# Patient Record
Sex: Female | Born: 1980 | Race: White | Hispanic: No | Marital: Single | Smoking: Current every day smoker
Health system: Southern US, Community
[De-identification: ages and names within clinical notes are randomized; demographics above are authoritative.]

## PROBLEM LIST (undated history)

## (undated) DIAGNOSIS — F410 Panic disorder [episodic paroxysmal anxiety] without agoraphobia: Secondary | ICD-10-CM

## (undated) DIAGNOSIS — G43909 Migraine, unspecified, not intractable, without status migrainosus: Secondary | ICD-10-CM

## (undated) DIAGNOSIS — F1910 Other psychoactive substance abuse, uncomplicated: Secondary | ICD-10-CM

## (undated) DIAGNOSIS — I10 Essential (primary) hypertension: Secondary | ICD-10-CM

## (undated) DIAGNOSIS — F419 Anxiety disorder, unspecified: Secondary | ICD-10-CM

## (undated) HISTORY — PX: CHOLECYSTECTOMY: SHX55

## (undated) HISTORY — PX: LIPOMA EXCISION: SHX5283

---

## 2013-09-02 ENCOUNTER — Emergency Department (HOSPITAL_BASED_OUTPATIENT_CLINIC_OR_DEPARTMENT_OTHER)
Admission: EM | Admit: 2013-09-02 | Discharge: 2013-09-02 | Disposition: A | Discharge status: Home / Self Care | Payer: Self-pay | Attending: Emergency Medicine | Admitting: Emergency Medicine

## 2013-09-02 ENCOUNTER — Encounter (HOSPITAL_BASED_OUTPATIENT_CLINIC_OR_DEPARTMENT_OTHER): Payer: Self-pay | Admitting: Emergency Medicine

## 2013-09-02 DIAGNOSIS — F17200 Nicotine dependence, unspecified, uncomplicated: Secondary | ICD-10-CM | POA: Insufficient documentation

## 2013-09-02 DIAGNOSIS — R51 Headache: Secondary | ICD-10-CM | POA: Insufficient documentation

## 2013-09-02 DIAGNOSIS — I10 Essential (primary) hypertension: Secondary | ICD-10-CM | POA: Diagnosis present

## 2013-09-02 DIAGNOSIS — Z3202 Encounter for pregnancy test, result negative: Secondary | ICD-10-CM | POA: Insufficient documentation

## 2013-09-02 DIAGNOSIS — Z8659 Personal history of other mental and behavioral disorders: Secondary | ICD-10-CM | POA: Insufficient documentation

## 2013-09-02 HISTORY — DX: Panic disorder (episodic paroxysmal anxiety): F41.0

## 2013-09-02 HISTORY — DX: Essential (primary) hypertension: I10

## 2013-09-02 HISTORY — DX: Anxiety disorder, unspecified: F41.9

## 2013-09-02 MED ORDER — LISINOPRIL-HYDROCHLOROTHIAZIDE 10-12.5 MG PO TABS: 1.0000 | ORAL_TABLET | Freq: Every day | ORAL | Status: AC

## 2013-09-02 MED ORDER — IBUPROFEN 400 MG PO TABS
600.0000 mg | ORAL_TABLET | Freq: Once | ORAL | Status: AC
  Administered 2013-09-02: 600 mg via ORAL
  Filled 2013-09-02 (×2): qty 1

## 2013-09-02 NOTE — ED Notes (Signed)
First day at [facility] blood pressure was high sent here for evaluation and possible medication. Pt reports to me that June through August she was in [facility] and they had her on hctz, lisinopril and norvasc was not given any medications when released and has not been to a doctor since then.

## 2013-09-02 NOTE — ED Provider Notes (Signed)
CSN: [id number]     Arrival date & time 09/02/13  1034 History   First MD Initiated Contact with Patient 09/02/13 1100     Chief Complaint  Patient presents with  . high blood pressure at [facility]    (Consider location/radiation/quality/duration/timing/severity/associated sxs/prior Treatment) Patient is a 32 y.o. female presenting with hypertension. The history is provided by the patient.  Hypertension This is a chronic problem. The current episode started more than 1 week ago. The problem occurs constantly. The problem has not changed since onset.Associated symptoms include headaches. Pertinent negatives include no chest pain, no abdominal pain and no shortness of breath. Nothing aggravates the symptoms. Nothing relieves the symptoms. She has tried nothing for the symptoms. The treatment provided no relief.    Past Medical History  Diagnosis Date  . Anxiety   . Panic attack   . Hypertension    Past Surgical History  Procedure Laterality Date  . Cholecystectomy    . Cesarean section     History reviewed. No pertinent family history. History  Substance Use Topics  . Smoking status: Current Every Day Smoker  . Smokeless tobacco: Not on file  . Alcohol Use: No   OB History   Grav Para Term Preterm Abortions TAB SAB Ect Mult Living                 Review of Systems  Constitutional: Negative for fever and fatigue.  HENT: Negative for congestion and drooling.   Eyes: Negative for pain.       Episode of double vision lasting a few minutes yesterday.   Respiratory: Negative for cough and shortness of breath.   Cardiovascular: Negative for chest pain.  Gastrointestinal: Negative for nausea, vomiting, abdominal pain and diarrhea.  Genitourinary: Negative for dysuria and hematuria.  Musculoskeletal: Negative for back pain, gait problem and neck pain.  Skin: Negative for color change.  Neurological: Positive for headaches. Negative for dizziness.  Hematological: Negative for  adenopathy.  Psychiatric/Behavioral: Negative for behavioral problems.  All other systems reviewed and are negative.    Allergies  Reglan  Home Medications  No current outpatient prescriptions on file. BP 176/117  Pulse 106  Temp(Src) 97.7 F (36.5 C) (Oral)  Ht 5\' 2"  (1.575 m)  Wt 167 lb 4 oz (75.864 kg)  BMI 30.58 kg/m2  SpO2 100%  LMP 08/30/2013 Physical Exam  Nursing note and vitals reviewed. Constitutional: She is oriented to person, place, and time. She appears well-developed and well-nourished.  HENT:  Head: Normocephalic.  Mouth/Throat: Oropharynx is clear and moist. No oropharyngeal exudate.  Eyes: Conjunctivae and EOM are normal. Pupils are equal, round, and reactive to light.  Neck: Normal range of motion. Neck supple.  Cardiovascular: Normal rate, regular rhythm, normal heart sounds and intact distal pulses.  Exam reveals no gallop and no friction rub.   No murmur heard. Pulmonary/Chest: Effort normal and breath sounds normal. No respiratory distress. She has no wheezes.  Abdominal: Soft. Bowel sounds are normal. There is no tenderness. There is no rebound and no guarding.  Musculoskeletal: Normal range of motion. She exhibits no edema and no tenderness.  Neurological: She is alert and oriented to person, place, and time. She has normal strength. No cranial nerve deficit or sensory deficit. She displays a negative Romberg sign. Coordination and gait normal.  Normal finger to nose bilaterally.  The patient is able to ambulate forwards and backwards without any difficulty.  Skin: Skin is warm and dry.  Psychiatric: She has a  normal mood and affect. Her behavior is normal.    ED Course  Procedures (including critical care time) Labs Review Labs Reviewed  PREGNANCY, URINE   Imaging Review No results found.  EKG Interpretation   None       MDM   1. Hypertension    11:15 AM 32 y.o. female in with a history of hypertension presents with elevated blood  pressure. The patient states that she is currently at a [facility] for Percocet and Xanax abuse. She is mildly hypertensive here and states that she has a 6/10 global headache. She states that she has a history of migraines and does have a history of daily headaches. She states that this headache is consistent with her typical headache. She is afebrile and vital signs are otherwise unremarkable. She has a normal neurologic exam here. She does note that she was hypertensive with previous pregnancies. Will check urine preg. will give Diprivan for her headache. She states that when she was incarcerated between July and August she was started on hydrochlorothiazide lisinopril and Norvasc. I think it is reasonable to restart the HCTZ lisinopril combo medicine.  11:42 AM:  Upreg neg. I have discussed the diagnosis/risks/treatment options with the patient and believe the pt to be eligible for discharge home to follow-up with pcp. We also discussed returning to the ED immediately if new or worsening sx occur. We discussed the sx which are most concerning (e.g., worsening HA, if BP doesn't respond to meds) that necessitate immediate return. Any new prescriptions provided to the patient are listed below.  New Prescriptions   LISINOPRIL-HYDROCHLOROTHIAZIDE (ZESTORETIC) 10-12.5 MG PER TABLET    Take 1 tablet by mouth daily.     Junius Argyle, MD 09/02/13 [phone]

## 2013-09-02 NOTE — ED Notes (Signed)
Pt states she is at [facility] and has been off of opiates for 2 months does not want any benzodiazepines or narcotics.

## 2013-09-08 ENCOUNTER — Encounter (HOSPITAL_BASED_OUTPATIENT_CLINIC_OR_DEPARTMENT_OTHER): Payer: Self-pay | Admitting: Emergency Medicine

## 2013-09-08 ENCOUNTER — Emergency Department (HOSPITAL_BASED_OUTPATIENT_CLINIC_OR_DEPARTMENT_OTHER)
Admission: EM | Admit: 2013-09-08 | Discharge: 2013-09-08 | Disposition: A | Discharge status: Home / Self Care | Payer: Self-pay | Attending: Emergency Medicine | Admitting: Emergency Medicine

## 2013-09-08 ENCOUNTER — Emergency Department (HOSPITAL_BASED_OUTPATIENT_CLINIC_OR_DEPARTMENT_OTHER): Payer: Self-pay

## 2013-09-08 DIAGNOSIS — R000 Tachycardia, unspecified: Secondary | ICD-10-CM | POA: Insufficient documentation

## 2013-09-08 DIAGNOSIS — F17200 Nicotine dependence, unspecified, uncomplicated: Secondary | ICD-10-CM | POA: Insufficient documentation

## 2013-09-08 DIAGNOSIS — I10 Essential (primary) hypertension: Secondary | ICD-10-CM | POA: Insufficient documentation

## 2013-09-08 DIAGNOSIS — Z79899 Other long term (current) drug therapy: Secondary | ICD-10-CM | POA: Insufficient documentation

## 2013-09-08 DIAGNOSIS — J029 Acute pharyngitis, unspecified: Secondary | ICD-10-CM | POA: Insufficient documentation

## 2013-09-08 DIAGNOSIS — J40 Bronchitis, not specified as acute or chronic: Secondary | ICD-10-CM | POA: Insufficient documentation

## 2013-09-08 DIAGNOSIS — Z8659 Personal history of other mental and behavioral disorders: Secondary | ICD-10-CM | POA: Insufficient documentation

## 2013-09-08 HISTORY — DX: Other psychoactive substance abuse, uncomplicated: F19.10

## 2013-09-08 HISTORY — DX: Migraine, unspecified, not intractable, without status migrainosus: G43.909

## 2013-09-08 MED ORDER — BENZONATATE 100 MG PO CAPS: 100.0000 mg | ORAL_CAPSULE | Freq: Three times a day (TID) | ORAL | Status: AC

## 2013-09-08 MED ORDER — BENZONATATE 100 MG PO CAPS: 100.0000 mg | ORAL_CAPSULE | Freq: Once | ORAL | Status: DC

## 2013-09-08 MED ORDER — BENZONATATE 100 MG PO CAPS
100.0000 mg | ORAL_CAPSULE | Freq: Once | ORAL | Status: AC
  Administered 2013-09-08: 100 mg via ORAL
  Filled 2013-09-08: qty 1

## 2013-09-08 MED ORDER — IBUPROFEN 800 MG PO TABS: 800.0000 mg | ORAL_TABLET | Freq: Three times a day (TID) | ORAL | Status: AC

## 2013-09-08 MED ORDER — KETOROLAC TROMETHAMINE 60 MG/2ML IM SOLN
60.0000 mg | Freq: Once | INTRAMUSCULAR | Status: AC
  Administered 2013-09-08: 60 mg via INTRAMUSCULAR
  Filled 2013-09-08: qty 2

## 2013-09-08 NOTE — ED Notes (Signed)
Productive Cough and runny nose x2 days.  Pt presents from [facility]

## 2013-09-08 NOTE — ED Notes (Signed)
MD at bedside. 

## 2013-09-08 NOTE — ED Provider Notes (Signed)
CSN: [id number]     Arrival date & time 09/08/13  4098 History   First MD Initiated Contact with Patient 09/08/13 [phone]     Chief Complaint  Patient presents with  . Cough  . Nasal Congestion   (Consider location/radiation/quality/duration/timing/severity/associated sxs/prior Treatment) Patient is a 32 y.o. female presenting with cough. The history is provided by the patient.  Cough Cough characteristics:  Productive Sputum characteristics:  Green Severity:  Moderate Onset quality:  Gradual Duration:  2 days Timing:  Constant Progression:  Worsening Chronicity:  New Smoker: yes   Context: upper respiratory infection   Context: not sick contacts and not weather changes   Relieved by:  Nothing Worsened by:  Nothing tried Ineffective treatments:  Cough suppressants Associated symptoms: rhinorrhea and sore throat   Associated symptoms: no chest pain, no fever and no shortness of breath     Past Medical History  Diagnosis Date  . Anxiety   . Panic attack   . Hypertension   . Substance abuse   . Migraine    Past Surgical History  Procedure Laterality Date  . Cholecystectomy    . Cesarean section    . Lipoma excision     No family history on file. History  Substance Use Topics  . Smoking status: Current Every Day Smoker -- 0.25 packs/day    Types: Cigarettes  . Smokeless tobacco: Not on file  . Alcohol Use: No   OB History   Grav Para Term Preterm Abortions TAB SAB Ect Mult Living                 Review of Systems  Constitutional: Negative for fever.  HENT: Positive for congestion, postnasal drip, rhinorrhea and sore throat.   Respiratory: Positive for cough. Negative for shortness of breath.   Cardiovascular: Negative for chest pain and leg swelling.  All other systems reviewed and are negative.    Allergies  Reglan  Home Medications   Current Outpatient Rx  Name  Route  Sig  Dispense  Refill  . lisinopril-hydrochlorothiazide (ZESTORETIC) 10-12.5 MG  per tablet   Oral   Take 1 tablet by mouth daily.   30 tablet   1    BP 151/107  Pulse 124  Temp(Src) 98.9 F (37.2 C) (Oral)  Resp 16  Ht 5\' 2"  (1.575 m)  Wt 167 lb (75.751 kg)  BMI 30.54 kg/m2  SpO2 99%  LMP 09/08/2013 Physical Exam  Nursing note and vitals reviewed. Constitutional: She is oriented to person, place, and time. She appears well-developed and well-nourished. No distress.  HENT:  Head: Normocephalic and atraumatic.  Eyes: EOM are normal. Pupils are equal, round, and reactive to light.  Neck: Normal range of motion. Neck supple.  Cardiovascular: Regular rhythm.  Tachycardia present.  Exam reveals no friction rub.   No murmur heard. Pulmonary/Chest: Effort normal and breath sounds normal. No respiratory distress. She has no wheezes. She has no rales.  Abdominal: Soft. She exhibits no distension. There is no tenderness. There is no rebound.  Musculoskeletal: Normal range of motion. She exhibits no edema.  Neurological: She is alert and oriented to person, place, and time.  Skin: She is not diaphoretic.    ED Course  Procedures (including critical care time) Labs Review Labs Reviewed - No data to display Imaging Review No results found.  EKG Interpretation   None       MDM   1. Bronchitis    36F presents with URI symptoms. Productive cough with  green sputum now after 2 days of nasal congestion, sore throat, headache. No fevers. Here tachycardic, states this is normal due to her panic disorder, normally has HRs in the 130s-140s.  TMs clear, oropharynx clear. No sinus tenderness. Lungs clear on CXR. Will obtain CXR. Toradol IM given.  X-ray negative for pneumonia. Patient's clinical picture consistent with bronchitis. Given Tessalon Perles and Motrin. stable for discharge.  Dagmar Hait, MD 09/08/13 [phone]

## 2013-09-08 NOTE — Discharge Instructions (Signed)

## 2013-10-05 ENCOUNTER — Encounter (HOSPITAL_BASED_OUTPATIENT_CLINIC_OR_DEPARTMENT_OTHER): Payer: Self-pay | Admitting: Emergency Medicine

## 2013-10-05 ENCOUNTER — Emergency Department (HOSPITAL_BASED_OUTPATIENT_CLINIC_OR_DEPARTMENT_OTHER)
Admission: EM | Admit: 2013-10-05 | Discharge: 2013-10-05 | Disposition: A | Discharge status: Home / Self Care | Payer: Self-pay | Attending: Emergency Medicine | Admitting: Emergency Medicine

## 2013-10-05 DIAGNOSIS — Z8659 Personal history of other mental and behavioral disorders: Secondary | ICD-10-CM | POA: Insufficient documentation

## 2013-10-05 DIAGNOSIS — A5901 Trichomonal vulvovaginitis: Secondary | ICD-10-CM | POA: Insufficient documentation

## 2013-10-05 DIAGNOSIS — I10 Essential (primary) hypertension: Secondary | ICD-10-CM | POA: Insufficient documentation

## 2013-10-05 DIAGNOSIS — N390 Urinary tract infection, site not specified: Secondary | ICD-10-CM | POA: Insufficient documentation

## 2013-10-05 DIAGNOSIS — Z87891 Personal history of nicotine dependence: Secondary | ICD-10-CM | POA: Insufficient documentation

## 2013-10-05 DIAGNOSIS — Z79899 Other long term (current) drug therapy: Secondary | ICD-10-CM | POA: Insufficient documentation

## 2013-10-05 DIAGNOSIS — R109 Unspecified abdominal pain: Secondary | ICD-10-CM

## 2013-10-05 DIAGNOSIS — Z3202 Encounter for pregnancy test, result negative: Secondary | ICD-10-CM | POA: Insufficient documentation

## 2013-10-05 LAB — URINE MICROSCOPIC-ADD ON

## 2013-10-05 LAB — URINALYSIS, ROUTINE W REFLEX MICROSCOPIC
Bilirubin Urine: NEGATIVE
GLUCOSE, UA: NEGATIVE mg/dL
KETONES UR: NEGATIVE mg/dL
NITRITE: NEGATIVE
PROTEIN: NEGATIVE mg/dL
Specific Gravity, Urine: 1.029 (ref 1.005–1.030)
Urobilinogen, UA: 1 mg/dL (ref 0.0–1.0)
pH: 6 (ref 5.0–8.0)

## 2013-10-05 LAB — WET PREP, GENITAL: Yeast Wet Prep HPF POC: NONE SEEN

## 2013-10-05 LAB — PREGNANCY, URINE: PREG TEST UR: NEGATIVE

## 2013-10-05 MED ORDER — AZITHROMYCIN 250 MG PO TABS
1000.0000 mg | ORAL_TABLET | Freq: Once | ORAL | Status: AC
  Administered 2013-10-05: 1000 mg via ORAL
  Filled 2013-10-05: qty 4

## 2013-10-05 MED ORDER — CEFTRIAXONE SODIUM 250 MG IJ SOLR
250.0000 mg | Freq: Once | INTRAMUSCULAR | Status: AC
  Administered 2013-10-05: 250 mg via INTRAMUSCULAR

## 2013-10-05 MED ORDER — LIDOCAINE HCL (PF) 1 % IJ SOLN
INTRAMUSCULAR | Status: AC
  Filled 2013-10-05: qty 5

## 2013-10-05 MED ORDER — METRONIDAZOLE 500 MG PO TABS
2000.0000 mg | ORAL_TABLET | Freq: Once | ORAL | Status: AC
  Administered 2013-10-05: 2000 mg via ORAL
  Filled 2013-10-05: qty 4

## 2013-10-05 MED ORDER — ONDANSETRON 8 MG PO TBDP
8.0000 mg | ORAL_TABLET | Freq: Once | ORAL | Status: AC
  Administered 2013-10-05: 8 mg via ORAL
  Filled 2013-10-05: qty 1

## 2013-10-05 MED ORDER — SULFAMETHOXAZOLE-TRIMETHOPRIM 800-160 MG PO TABS: 1.0000 | ORAL_TABLET | Freq: Two times a day (BID) | ORAL | Status: AC

## 2013-10-05 MED ORDER — KETOROLAC TROMETHAMINE 60 MG/2ML IM SOLN
30.0000 mg | Freq: Once | INTRAMUSCULAR | Status: AC
  Administered 2013-10-05: 30 mg via INTRAMUSCULAR
  Filled 2013-10-05: qty 2

## 2013-10-05 NOTE — ED Notes (Signed)
Notices blood after urinating also having some lower abdominal cramping and low back pain

## 2013-10-05 NOTE — ED Provider Notes (Signed)
CSN: [id number]     Arrival date & time 10/05/13  0824 History   First MD Initiated Contact with Patient 10/05/13 [phone]     Chief Complaint  Patient presents with  . possible uti    Patient is a 33 y.o. female presenting with cramps. The history is provided by the patient.  Abdominal Cramping This is a new problem. The current episode started yesterday. The problem has not changed since onset.Associated symptoms include abdominal pain. Pertinent negatives include no chest pain. Exacerbated by: urination. Nothing relieves the symptoms. Treatments tried: ibuprofen. The treatment provided no relief.    Past Medical History  Diagnosis Date  . Anxiety   . Panic attack   . Hypertension   . Substance abuse   . Migraine    Past Surgical History  Procedure Laterality Date  . Cholecystectomy    . Cesarean section    . Lipoma excision     History reviewed. No pertinent family history. History  Substance Use Topics  . Smoking status: Former Smoker -- 0.25 packs/day    Types: Cigarettes  . Smokeless tobacco: Not on file  . Alcohol Use: No   OB History   Grav Para Term Preterm Abortions TAB SAB Ect Mult Living                 Review of Systems  Constitutional: Negative for fever.  Cardiovascular: Negative for chest pain.  Gastrointestinal: Positive for abdominal pain. Negative for vomiting.  Genitourinary: Positive for frequency and flank pain. Negative for vaginal bleeding and vaginal discharge.       "abnormal smell"   Neurological: Negative for weakness.  All other systems reviewed and are negative.    Allergies  Reglan  Home Medications   Current Outpatient Rx  Name  Route  Sig  Dispense  Refill  . benzonatate (TESSALON) 100 MG capsule   Oral   Take 1 capsule (100 mg total) by mouth every 8 (eight) hours.   21 capsule   0   . ibuprofen (ADVIL,MOTRIN) 800 MG tablet   Oral   Take 1 tablet (800 mg total) by mouth 3 (three) times daily.   21 tablet   0   .  lisinopril-hydrochlorothiazide (ZESTORETIC) 10-12.5 MG per tablet   Oral   Take 1 tablet by mouth daily.   30 tablet   1    BP 143/95  Pulse 112  Temp(Src) 97.8 F (36.6 C) (Oral)  Resp 18  Ht 5\' 2"  (1.575 m)  Wt 167 lb (75.751 kg)  BMI 30.54 kg/m2  SpO2 100%  LMP 09/30/2013 Physical Exam CONSTITUTIONAL: Well developed/well nourished HEAD: Normocephalic/atraumatic EYES: EOMI/PERRL ENMT: Mucous membranes moist NECK: supple no meningeal signs SPINE:entire spine nontender CV: S1/S2 noted, no murmurs/rubs/gallops noted LUNGS: Lungs are clear to auscultation bilaterally, no apparent distress ABDOMEN: soft, nontender, no rebound or guarding GU:no cva tenderness, mild cmt, no adnexal tenderness/mass noted. Small amt of discharge, No vag bleeding noted.  Female chaperone present NEURO: Pt is awake/alert, moves all extremitiesx4 EXTREMITIES: pulses normal, full ROM SKIN: warm, color normal PSYCH: no abnormalities of mood noted  ED Course  Procedures (including critical care time) Labs Review Labs Reviewed  WET PREP, GENITAL  GC/CHLAMYDIA PROBE AMP  URINALYSIS, ROUTINE W REFLEX MICROSCOPIC  PREGNANCY, URINE   Imaging Review No results found.  EKG Interpretation   None      9:04 AM Pt requests toradol at this time Advised pelvic rest and no sexual activity until all symptoms resolved  and tests are resulted Will treat for trich, uti and also empirically treat for GC/Chlamydia as she thinks she may have been exposed MDM  No diagnosis found. Nursing notes including past medical history and social history reviewed and considered in documentation Labs/vital reviewed and considered     Joya Gaskins, MD 10/05/13 1007

## 2013-10-05 NOTE — ED Notes (Signed)
Patient states she is currently a resident at [facility].

## 2013-10-06 LAB — GC/CHLAMYDIA PROBE AMP
CT Probe RNA: NEGATIVE
GC Probe RNA: NEGATIVE

## 2013-10-06 LAB — URINE CULTURE: Colony Count: 7000

## 2013-10-10 ENCOUNTER — Emergency Department (HOSPITAL_BASED_OUTPATIENT_CLINIC_OR_DEPARTMENT_OTHER): Payer: Self-pay

## 2013-10-10 ENCOUNTER — Encounter (HOSPITAL_BASED_OUTPATIENT_CLINIC_OR_DEPARTMENT_OTHER): Payer: Self-pay | Admitting: Emergency Medicine

## 2013-10-10 ENCOUNTER — Emergency Department (HOSPITAL_BASED_OUTPATIENT_CLINIC_OR_DEPARTMENT_OTHER)
Admission: EM | Admit: 2013-10-10 | Discharge: 2013-10-11 | Disposition: A | Discharge status: Home / Self Care | Payer: Self-pay | Attending: Emergency Medicine | Admitting: Emergency Medicine

## 2013-10-10 DIAGNOSIS — Z87891 Personal history of nicotine dependence: Secondary | ICD-10-CM | POA: Insufficient documentation

## 2013-10-10 DIAGNOSIS — Z79899 Other long term (current) drug therapy: Secondary | ICD-10-CM | POA: Insufficient documentation

## 2013-10-10 DIAGNOSIS — I10 Essential (primary) hypertension: Secondary | ICD-10-CM | POA: Insufficient documentation

## 2013-10-10 DIAGNOSIS — Z791 Long term (current) use of non-steroidal anti-inflammatories (NSAID): Secondary | ICD-10-CM | POA: Insufficient documentation

## 2013-10-10 DIAGNOSIS — F410 Panic disorder [episodic paroxysmal anxiety] without agoraphobia: Secondary | ICD-10-CM | POA: Insufficient documentation

## 2013-10-10 DIAGNOSIS — X500XXA Overexertion from strenuous movement or load, initial encounter: Secondary | ICD-10-CM | POA: Insufficient documentation

## 2013-10-10 DIAGNOSIS — Y929 Unspecified place or not applicable: Secondary | ICD-10-CM | POA: Insufficient documentation

## 2013-10-10 DIAGNOSIS — S93409A Sprain of unspecified ligament of unspecified ankle, initial encounter: Secondary | ICD-10-CM | POA: Insufficient documentation

## 2013-10-10 DIAGNOSIS — Y9302 Activity, running: Secondary | ICD-10-CM | POA: Insufficient documentation

## 2013-10-10 MED ORDER — MELOXICAM 7.5 MG PO TABS: 7.5000 mg | ORAL_TABLET | Freq: Every day | ORAL | Status: AC

## 2013-10-10 NOTE — ED Notes (Signed)
Pt states was running and tripped over curb twisting ankle. Now unable to bear wt on Left foot

## 2013-10-10 NOTE — ED Provider Notes (Signed)
CSN: [id number]     Arrival date & time 10/10/13  2131 History  This chart was scribed for [name], MD by [name], ED Scribe. This patient was seen in room  and the patient's care was started 11:41 PM.    Chief Complaint  Patient presents with  . Ankle Pain    Patient is a 33 y.o. female presenting with ankle pain. The history is provided by the patient. No language interpreter was used.  Ankle Pain Location:  Ankle Injury: yes   Mechanism of injury comment:  Twisted coming off a curb Ankle location:  L ankle Pain details:    Quality:  Aching   Radiates to:  Does not radiate   Severity:  Moderate   Onset quality:  Sudden   Timing:  Constant   Progression:  Unchanged Chronicity:  New Dislocation: no   Foreign body present:  No foreign bodies Relieved by:  Nothing Worsened by:  Nothing tried Ineffective treatments:  None tried Associated symptoms: no back pain   Risk factors: no concern for non-accidental trauma     HPI Comments: Michelle Burke is a 33 y.o. female who presents to the Emergency Department complaining of a left ankle injury that occurred 2-3 hours ago. Pt states she was running when she tripped over a curb and twisted her ankle. She denies any other injuries. She denies numbness or weakness.   Past Medical History  Diagnosis Date  . Anxiety   . Panic attack   . Hypertension   . Substance abuse   . Migraine    Past Surgical History  Procedure Laterality Date  . Cholecystectomy    . Cesarean section    . Lipoma excision     History reviewed. No pertinent family history. History  Substance Use Topics  . Smoking status: Former Smoker -- 0.25 packs/day    Types: Cigarettes  . Smokeless tobacco: Not on file  . Alcohol Use: No   OB History   Grav Para Term Preterm Abortions TAB SAB Ect Mult Living                 Review of Systems  Musculoskeletal: Positive for arthralgias (left ankle pain). Negative for back pain and gait problem.   Neurological: Negative for weakness and numbness.  All other systems reviewed and are negative.    Allergies  Reglan  Home Medications   Current Outpatient Rx  Name  Route  Sig  Dispense  Refill  . QUEtiapine (SEROQUEL) 100 MG tablet   Oral   Take 150 mg by mouth at bedtime.         . traZODone (DESYREL) 50 MG tablet   Oral   Take 50 mg by mouth at bedtime.         . benzonatate (TESSALON) 100 MG capsule   Oral   Take 1 capsule (100 mg total) by mouth every 8 (eight) hours.   21 capsule   0   . ibuprofen (ADVIL,MOTRIN) 800 MG tablet   Oral   Take 1 tablet (800 mg total) by mouth 3 (three) times daily.   21 tablet   0   . lisinopril-hydrochlorothiazide (ZESTORETIC) 10-12.5 MG per tablet   Oral   Take 1 tablet by mouth daily.   30 tablet   1   . sulfamethoxazole-trimethoprim (BACTRIM DS,SEPTRA DS) 800-160 MG per tablet   Oral   Take 1 tablet by mouth 2 (two) times daily.   6 tablet   0  BP 131/90  Pulse 102  Temp(Src) 98.3 F (36.8 C) (Oral)  Resp 18  Wt 173 lb (78.472 kg)  SpO2 100%  LMP 09/30/2013 Physical Exam  Nursing note and vitals reviewed. Constitutional: She is oriented to person, place, and time. She appears well-developed and well-nourished.  HENT:  Head: Normocephalic and atraumatic.  Mouth/Throat: Uvula is midline, oropharynx is clear and moist and mucous membranes are normal. No oropharyngeal exudate.  Eyes: EOM are normal.  Neck: Normal range of motion. Neck supple.  Cardiovascular: Normal rate, regular rhythm and normal heart sounds.   Pulses:      Dorsalis pedis pulses are 3+ on the left side.  Good cap refill < 2 seconds on all toes.  Pulmonary/Chest: Effort normal and breath sounds normal. No respiratory distress. She has no wheezes. She has no rales.  Abdominal: Soft. Bowel sounds are normal. She exhibits no distension. There is no tenderness. There is no rebound and no guarding.  Musculoskeletal: Normal range of motion. She  exhibits tenderness.  Tenderness to palpation of the anterior talor ligament. No crepitus or deformity.  Neurological: She is alert and oriented to person, place, and time. She has normal reflexes. She displays normal reflexes.  NVI distally. Full ROM. Achilles intact.   Skin: Skin is warm and dry.  Psychiatric: She has a normal mood and affect.    ED Course  Procedures (including critical care time)  DIAGNOSTIC STUDIES: Oxygen Saturation is 100% on RA, normal by my interpretation.    COORDINATION OF CARE: 11:41 PM Discussed treatment plan with pt at bedside and pt agreed to plan.   Labs Review Labs Reviewed - No data to display Imaging Review Dg Ankle Complete Left  10/10/2013   CLINICAL DATA:  Left ankle pain after fall.  EXAM: LEFT ANKLE COMPLETE - 3+ VIEW  COMPARISON:  None.  FINDINGS: There is no evidence of fracture, dislocation, or joint effusion. There is no evidence of arthropathy or other focal bone abnormality. Soft tissue swelling over lateral malleolus is noted.  IMPRESSION: No fracture or dislocation is noted. Soft tissue swelling is seen over lateral malleolus suggesting ligamentous injury.   Electronically Signed   By: Roque LiasJames  Green M.D.   On: 10/10/2013 22:51    EKG Interpretation   None       MDM  No diagnosis found.  Will discharge home with ankle ASO, NSAIDs and advice to elevate and ice.   I personally performed the services described in this documentation, which was scribed in my presence. The recorded information has been reviewed and is accurate.    Michelle AweApril K Ailin Rochford-Rasch, MD 10/11/13 [phone]

## 2013-10-10 NOTE — Discharge Instructions (Signed)
Acute Ankle Sprain °with Phase II Rehab °An acute ankle sprain is a partial or complete tear in one or more of the ligaments of the ankle due to traumatic injury. The severity of the injury depends on both the number of ligaments sprained and the grade of sprain. There are 3 grades of sprains. °· A grade 1 sprain is a mild sprain. There is a slight pull without obvious tearing. There is no loss of strength, and the muscle and ligament are the correct length. °· A grade 2 sprain is a moderate sprain. There is tearing of fibers within the substance of the ligament where it connects two bones or two cartilages. The length of the ligament is increased, and there is usually decreased strength. °· A grade 3 sprain is a complete rupture of the ligament and is uncommon. °In addition to the grade of sprain, there are 3 types of ankle sprains.  °Lateral ankle sprains. This is a sprain of one or more of the 3 ligaments on the outer side (lateral) of the ankle. These are the most common sprains. °Medial ankle sprains. There is one large triangular ligament on the inner side (medial) of the ankle that is susceptible to injury. Medial ankle sprains are less common. °Syndesmosis, "high ankle," sprains. The syndesmosis is the ligament that connects the two bones of the lower leg. Syndesmosis sprains usually only occur with very severe ankle sprains. °SYMPTOMS °· Pain, tenderness, and swelling in the ankle, starting at the side of injury that may progress to the whole ankle and foot with time. °· "Pop" or tearing sensation at the time of injury. °· Bruising that may spread to the heel. °· Impaired ability to walk soon after injury. °CAUSES  °· Acute ankle sprains are caused by trauma placed on the ankle that temporarily forces or pries the anklebone (talus) out of its normal socket. °· Stretching or tearing of the ligaments that normally hold the joint in place (usually due to a twisting injury). °RISK INCREASES WITH: °· Previous  ankle sprain. °· Sports in which the foot may land awkwardly (basketball, volleyball, soccer) or walking or running on uneven or rough surfaces. °· Shoes with inadequate support to prevent sideways motion when stress occurs. °· Poor strength and flexibility. °· Poor balance skills. °· Contact sports. °PREVENTION °· Warm up and stretch properly before activity. °· Maintain physical fitness: °· Ankle and leg flexibility, muscle strength, and endurance. °· Cardiovascular fitness. °· Balance training activities. °· Use proper technique and have a coach correct improper technique. °· Taping, protective strapping, bracing, or high-top tennis shoes may help prevent injury. Initially, tape is best. However, it loses most of its support function within 10 to 15 minutes. °· Wear proper fitted protective shoes. Combining high-top shoes with taping or bracing is more effective than using either alone. °· Provide the ankle with support during sports and practice activities for 12 months following injury. °PROGNOSIS  °· If treated properly, ankle sprains can be expected to recover completely. However, the length of recovery depends on the degree of injury. °· A grade 1 sprain usually heals enough in 5 to 7 days to allow modified activity and requires an average of 6 weeks to heal completely. °· A grade 2 sprain requires 6 to 10 weeks to heal completely. °· A grade 3 sprain requires 12 to 16 weeks to heal. °· A syndesmosis sprain often takes more than 3 months to heal. °RELATED COMPLICATIONS  °· Frequent recurrence of symptoms may result   in a chronic problem. Appropriately addressing the problem the first time decreases the frequency of recurrence and optimizes healing time. Severity of initial sprain does not predict the likelihood of later instability. °· Injury to other structures (bone, cartilage, or tendon). °· Chronically unstable or arthritic ankle joint are possible with repeated sprains. °TREATMENT °Treatment initially  involves the use of ice, medicine, and compression bandages to help reduce pain and inflammation. Ankle sprains are usually immobilized in a walking cast or boot to allow for healing. Crutches may be recommended to reduce pressure on the injury. After immobilization, strengthening and stretching exercises may be necessary to regain strength and a full range of motion. Surgery is rarely needed to treat ankle sprains. °MEDICATION  °· Nonsteroidal anti-inflammatory medicines, such as aspirin and ibuprofen (do not take for the first 3 days after injury or within 7 days before surgery), or other minor pain relievers, such as acetaminophen, are often recommended. Take these as directed by your caregiver. Contact your caregiver immediately if any bleeding, stomach upset, or signs of an allergic reaction occur from these medicines. °· Ointments applied to the skin may be helpful. °· Pain relievers may be prescribed as necessary by your caregiver. Do not take prescription pain medicine for longer than 4 to 7 days. Use only as directed and only as much as you need. °HEAT AND COLD °· Cold treatment (icing) is used to relieve pain and reduce inflammation for acute and chronic cases. Cold should be applied for 10 to 15 minutes every 2 to 3 hours for inflammation and pain and immediately after any activity that aggravates your symptoms. Use ice packs or an ice massage. °· Heat treatment may be used before performing stretching and strengthening activities prescribed by your caregiver. Use a heat pack or a warm soak. °SEEK IMMEDIATE MEDICAL CARE IF:  °· Pain, swelling, or bruising worsens despite treatment. °· You experience pain, numbness, discoloration, or coldness in the foot or toes. °· New, unexplained symptoms develop. (Drugs used in treatment may produce side effects.) °EXERCISES  °PHASE II EXERCISES °RANGE OF MOTION (ROM) AND STRETCHING EXERCISES - Ankle Sprain, Acute-Phase II, Weeks 3 to 4 °After your physician, physical  therapist, or athletic trainer feels your knee has made progress significant enough to begin more advanced exercises, he or she may recommend completing some of the following exercises. Although each person heals at different rates, most people will be ready for these exercises between 3 and 4 weeks after their injury. Do not begin these exercises until you have your caregiver's permission. He or she may also advise you to continue with the exercises which you completed in Phase I of your rehabilitation. While completing these exercises, remember:  °· Restoring tissue flexibility helps normal motion to return to the joints. This allows healthier, less painful movement and activity. °· An effective stretch should be held for at least 30 seconds. °· A stretch should never be painful. You should only feel a gentle lengthening or release in the stretched tissue. °RANGE OF MOTION - Ankle Plantar Flexion  °· Sit with your right / left leg crossed over your opposite knee. °· Use your opposite hand to pull the top of your foot and toes toward you. °· You should feel a gentle stretch on the top of your foot/ankle. Hold this position for __________. °Repeat __________ times. Complete __________ times per day.  °RANGE OF MOTION - Ankle Eversion °· Sit with your right / left ankle crossed over your opposite knee. °·   Grip your foot with your opposite hand, placing your thumb on the top of your foot and your fingers across the bottom of your foot. °· Gently push your foot downward with a slight rotation so your littlest toes rise slightly °· You should feel a gentle stretch on the inside of your ankle. Hold the stretch for __________ seconds. °Repeat __________ times. Complete this exercise __________ times per day.  °RANGE OF MOTION - Ankle Inversion °· Sit with your right / left ankle crossed over your opposite knee. °· Grip your foot with your opposite hand, placing your thumb on the bottom of your foot and your fingers across  the top of your foot. °· Gently pull your foot so the smallest toe comes toward you and your thumb pushes the inside of the ball of your foot away from you. °· You should feel a gentle stretch on the outside of your ankle. Hold the stretch for __________ seconds. °Repeat __________ times. Complete this exercise __________ times per day.  °STRETCH - Gastrocsoleus °· Sit with your right / left leg extended. Holding onto both ends of a belt or towel, loop it around the ball of your foot. °· Keeping your right / left ankle and foot relaxed and your knee straight, pull your foot and ankle toward you using the belt/towel. °· You should feel a gentle stretch behind your calf or knee. Hold this position for __________ seconds. °Repeat __________ times. Complete this stretch __________ times per day.  °RANGE OF MOTION - Ankle Dorsiflexion, Active Assisted °· Remove shoes and sit on a chair that is preferably not on a carpeted surface. °· Place right / left foot under knee. Extend your opposite leg for support. °· Keeping your heel down, slide your right / left foot back toward the chair until you feel a stretch at your ankle or calf. If you do not feel a stretch, slide your bottom forward to the edge of the chair while still keeping your heel down. °· Hold this stretch for __________ seconds. °Repeat __________ times. Complete this stretch __________ times per day.  °STRETCH  Gastroc, Standing  °· Place hands on wall. °· Extend right / left leg and place a folded washcloth under the arch of your foot for support. Keep the front knee somewhat bent. °· Slightly point your toes inward on your back foot. °· Keeping your right / left heel on the floor and your knee straight, shift your weight toward the wall, not allowing your back to arch. °· You should feel a gentle stretch in the calf. Hold this position for __________ seconds. °Repeat __________ times. Complete this stretch __________ times per day. °STRETCH  Soleus,  Standing °· Place hands on wall. °· Extend right / left leg and place a folded washcloth under the arch of your foot for support. Keep the front knee somewhat bent. °· Slightly point your toes inward on your back foot. °· Keep your right / left heel on the floor, bend your back knee, and slightly shift your weight over the back leg so that you feel a gentle stretch deep in your back calf. °· Hold this position for __________ seconds. °Repeat __________ times. Complete this stretch __________ times per day. °STRETCH  Gastrocsoleus, Standing °Note: This exercise can place a lot of stress on your foot and ankle. Please complete this exercise only if specifically instructed by your caregiver.  °· Place the ball of your right / left foot on a step, keeping your other   foot firmly on the same step. °· Hold on to the wall or a rail for balance. °· Slowly lift your other foot, allowing your body weight to press your heel down over the edge of the step. °· You should feel a stretch in your right / left calf. °· Hold this position for __________ seconds. °· Repeat this exercise with a slight bend in your knee. °Repeat __________ times. Complete this stretch __________ times per day.  °STRENGTHENING EXERCISES - Ankle Sprain, Acute-Phase II °Around 3 to 4 weeks after your injury, you may progress to some of these exercises in your rehabilitation program. Do not begin these until you have your caregiver's permission. Although your condition has improved, the Phase I exercises will continue to be helpful and you may continue to complete them. As you complete strengthening exercises, remember:  °· Strong muscles with good endurance tolerate stress better. °· Do the exercises as initially prescribed by your caregiver. Progress slowly with each exercise, gradually increasing the number of repetitions and weight used under his or her guidance. °· You may experience muscle soreness or fatigue, but the pain or discomfort you are trying  to eliminate should never worsen during these exercises. If this pain does worsen, stop and make certain you are following the directions exactly. If the pain is still present after adjustments, discontinue the exercise until you can discuss the trouble with your caregiver. °STRENGTH - Plantar-flexors, Standing °· Stand with your feet shoulder width apart. Steady yourself with a wall or table using as little support as needed. °· Keeping your weight evenly spread over the width of your feet, rise up on your toes.* °· Hold this position for __________ seconds. °Repeat __________ times. Complete this exercise __________ times per day.  °*If this is too easy, shift your weight toward your right / left leg until you feel challenged. Ultimately, you may be asked to do this exercise with your right / left foot only. °STRENGTH  Dorsiflexors and Plantar-flexors, Heel/toe Walking °· Dorsiflexion: Walk on your heels only. Keep your toes as high as possible. °· Walk for ____________________ seconds/feet. °· Repeat __________ times. Complete __________ times per day. °· Plantar flexion: Walk on your toes only. Keep your heels as high as possible. °· Walk for ____________________ seconds/feet. °Repeat __________ times. Complete __________ times per day.  °BALANCE  Tandem Walking °· Place your uninjured foot on a line 2 to 4 inches wide and at least 10 feet long. °· Keeping your balance without using anything for extra support, place your right / left heel directly in front of your other foot. °· Slowly raise your back foot up, lifting from the heel to the toes, and place it directly in front of the right / left foot. °· Continue to walk along the line slowly. Walk for ____________________ feet. °Repeat ____________________ times. Complete ____________________ times per day. °BALANCE - Inversion/Eversion °Use caution, these are advanced level exercises. Do not begin them until you are advised to do so.  °· Create a balance board  using a sturdy board about 1 ½ feet long and at 1 to 1 ½ feet wide and a 1 ½ inch diameter rod or pipe that is as long as the board's width. A copper pipe or a solid broomstick work well. °· Stand on a non-carpeted surface near a countertop or wall. Step onto the board so that your feet are hip-width apart and equally straddle the rod/pipe. °· Keeping your feet in place, complete these two exercises   without shifting your upper body or hips: °· Tip the board from side-to-side. Control the movement so the board does not forcefully strike the ground. The board should silently tap the ground. °· Tip the board side-to-side without striking the ground. Occasionally pause and maintain a steady position at various points. °· Repeat the first two exercises, but use only your right / left foot. Place your right / left foot directly over the rod/pipe. °Repeat __________ times. Complete this exercise __________ times a day. °BALANCE - Plantar/Dorsi Flexion °Use caution, these are advanced level exercises. Do not begin them until you are advised to do so.  °· Create a balance board using a sturdy board about 1 ½ feet long and at 1 to 1 ½ feet wide and a 1 ½ inch diameter rod or pipe that is as long as the board's width. A copper pipe or a solid broomstick work well. °· Stand on a non-carpeted surface near a countertop or wall. Stand on the board so that the rod/pipe runs under the arches in your feet. °· Keeping your feet in place, complete these two exercises without shifting your upper body or hips: °· Tip the board from side-to-side. Control the movement so the board does not forcefully strike the ground. The board should silently tap the ground. °· Tip the board side-to-side without striking the ground. Occasionally pause and maintain a steady position at various points. °· Repeat the first two exercises, but use only your right / left foot. Stand in the center of the board. °Repeat __________ times. Complete this exercise  __________ times a day. °STRENGTH  Plantar-flexors, Eccentric °Note: This exercise can place a lot of stress on your foot and ankle. Please complete this exercise only if specifically instructed by your caregiver.  °· Place the balls of your feet on a step. With your hands, use only enough support from a wall or rail to keep your balance. °· Keep your knees straight and rise up on your toes. °· Slowly shift your weight entirely to your toes and pick up your opposite foot. Gently and with controlled movement, lower your weight through your right / left foot so that your heel drops below the level of the step. You will feel a slight stretch in the back of your calf at the ending position. °· Use the healthy leg to help rise up onto the balls of both feet, then lower weight only on the right / left leg again. Build up to 15 repetitions. Then progress to 3 consecutive sets of 15 repetitions.* °· After completing the above exercise, complete the same exercise with a slight knee bend (about 30 degrees). Again, build up to 15 repetitions. Then progress to 3 consecutive sets of 15 repetitions.* °Perform this exercise __________ times per day.  °*When you easily complete 3 sets of 15, your physician, physical therapist, or athletic trainer may advise you to add resistance by wearing a backpack filled with additional weight. °Document Released: 01/07/2006 Document Revised: 12/10/2011 Document Reviewed: 12/30/2008 °[employer]® Patient Information ©2014 [employer]. ° °

## 2013-10-14 ENCOUNTER — Emergency Department (HOSPITAL_BASED_OUTPATIENT_CLINIC_OR_DEPARTMENT_OTHER)
Admission: EM | Admit: 2013-10-14 | Discharge: 2013-10-14 | Disposition: A | Discharge status: Home / Self Care | Payer: Self-pay | Attending: Emergency Medicine | Admitting: Emergency Medicine

## 2013-10-14 ENCOUNTER — Encounter (HOSPITAL_BASED_OUTPATIENT_CLINIC_OR_DEPARTMENT_OTHER): Payer: Self-pay | Admitting: Emergency Medicine

## 2013-10-14 DIAGNOSIS — T50901A Poisoning by unspecified drugs, medicaments and biological substances, accidental (unintentional), initial encounter: Secondary | ICD-10-CM

## 2013-10-14 DIAGNOSIS — T43501A Poisoning by unspecified antipsychotics and neuroleptics, accidental (unintentional), initial encounter: Secondary | ICD-10-CM | POA: Insufficient documentation

## 2013-10-14 DIAGNOSIS — I10 Essential (primary) hypertension: Secondary | ICD-10-CM | POA: Insufficient documentation

## 2013-10-14 DIAGNOSIS — F1190 Opioid use, unspecified, uncomplicated: Secondary | ICD-10-CM

## 2013-10-14 DIAGNOSIS — Z79899 Other long term (current) drug therapy: Secondary | ICD-10-CM | POA: Insufficient documentation

## 2013-10-14 DIAGNOSIS — F410 Panic disorder [episodic paroxysmal anxiety] without agoraphobia: Secondary | ICD-10-CM | POA: Insufficient documentation

## 2013-10-14 DIAGNOSIS — T43204A Poisoning by unspecified antidepressants, undetermined, initial encounter: Secondary | ICD-10-CM | POA: Insufficient documentation

## 2013-10-14 DIAGNOSIS — R4789 Other speech disturbances: Secondary | ICD-10-CM | POA: Insufficient documentation

## 2013-10-14 DIAGNOSIS — T43591A Poisoning by other antipsychotics and neuroleptics, accidental (unintentional), initial encounter: Secondary | ICD-10-CM | POA: Insufficient documentation

## 2013-10-14 DIAGNOSIS — F17200 Nicotine dependence, unspecified, uncomplicated: Secondary | ICD-10-CM | POA: Insufficient documentation

## 2013-10-14 DIAGNOSIS — Y9210 Unspecified residential institution as the place of occurrence of the external cause: Secondary | ICD-10-CM | POA: Insufficient documentation

## 2013-10-14 DIAGNOSIS — Z791 Long term (current) use of non-steroidal anti-inflammatories (NSAID): Secondary | ICD-10-CM | POA: Insufficient documentation

## 2013-10-14 DIAGNOSIS — T43201A Poisoning by unspecified antidepressants, accidental (unintentional), initial encounter: Secondary | ICD-10-CM | POA: Insufficient documentation

## 2013-10-14 DIAGNOSIS — Z3202 Encounter for pregnancy test, result negative: Secondary | ICD-10-CM | POA: Insufficient documentation

## 2013-10-14 DIAGNOSIS — Y9389 Activity, other specified: Secondary | ICD-10-CM | POA: Insufficient documentation

## 2013-10-14 DIAGNOSIS — F1110 Opioid abuse, uncomplicated: Secondary | ICD-10-CM | POA: Insufficient documentation

## 2013-10-14 LAB — RAPID URINE DRUG SCREEN, HOSP PERFORMED
Amphetamines: POSITIVE — AB
Barbiturates: NOT DETECTED
Benzodiazepines: POSITIVE — AB
COCAINE: NOT DETECTED
OPIATES: NOT DETECTED
TETRAHYDROCANNABINOL: NOT DETECTED

## 2013-10-14 LAB — PREGNANCY, URINE: Preg Test, Ur: NEGATIVE

## 2013-10-14 LAB — CBC WITH DIFFERENTIAL/PLATELET
Basophils Absolute: 0 10*3/uL (ref 0.0–0.1)
Basophils Relative: 1 % (ref 0–1)
Eosinophils Absolute: 0.1 10*3/uL (ref 0.0–0.7)
Eosinophils Relative: 1 % (ref 0–5)
HEMATOCRIT: 38 % (ref 36.0–46.0)
HEMOGLOBIN: 12.2 g/dL (ref 12.0–15.0)
LYMPHS PCT: 29 % (ref 12–46)
Lymphs Abs: 2.1 10*3/uL (ref 0.7–4.0)
MCH: 28.4 pg (ref 26.0–34.0)
MCHC: 32.1 g/dL (ref 30.0–36.0)
MCV: 88.4 fL (ref 78.0–100.0)
MONO ABS: 0.4 10*3/uL (ref 0.1–1.0)
MONOS PCT: 5 % (ref 3–12)
NEUTROS ABS: 4.5 10*3/uL (ref 1.7–7.7)
Neutrophils Relative %: 63 % (ref 43–77)
Platelets: 273 10*3/uL (ref 150–400)
RBC: 4.3 MIL/uL (ref 3.87–5.11)
RDW: 14.6 % (ref 11.5–15.5)
WBC: 7 10*3/uL (ref 4.0–10.5)

## 2013-10-14 LAB — SALICYLATE LEVEL: Salicylate Lvl: <2 mg/dL — ABNORMAL LOW (ref 2.8–20.0)

## 2013-10-14 LAB — BASIC METABOLIC PANEL
BUN: 18 mg/dL (ref 6–23)
CHLORIDE: 101 meq/L (ref 96–112)
CO2: 28 mEq/L (ref 19–32)
CREATININE: 0.8 mg/dL (ref 0.50–1.10)
Calcium: 9.1 mg/dL (ref 8.4–10.5)
GFR calc Af Amer: >90 mL/min (ref >90)
GFR calc non Af Amer: >90 mL/min (ref >90)
Glucose, Bld: 101 mg/dL — ABNORMAL HIGH (ref 70–99)
Potassium: 4 mEq/L (ref 3.7–5.3)
Sodium: 141 mEq/L (ref 137–147)

## 2013-10-14 LAB — ACETAMINOPHEN LEVEL: Acetaminophen (Tylenol), Serum: <15 ug/mL (ref 10–30)

## 2013-10-14 LAB — ETHANOL: Alcohol, Ethyl (B): <11 mg/dL (ref 0–11)

## 2013-10-14 MED ORDER — NALOXONE HCL 0.4 MG/ML IJ SOLN
0.4000 mg | Freq: Once | INTRAMUSCULAR | Status: AC
  Administered 2013-10-14: 0.4 mg via INTRAVENOUS
  Filled 2013-10-14: qty 1

## 2013-10-14 MED ORDER — NALOXONE HCL 0.4 MG/ML IJ SOLN
INTRAMUSCULAR | Status: AC
  Filled 2013-10-14: qty 1

## 2013-10-14 MED ORDER — NALOXONE HCL 0.4 MG/ML IJ SOLN
0.4000 mg | Freq: Once | INTRAMUSCULAR | Status: AC
  Administered 2013-10-14: 0.4 mg via INTRAVENOUS

## 2013-10-14 MED ORDER — SODIUM CHLORIDE 0.9 % IV SOLN
INTRAVENOUS | Status: DC
  Administered 2013-10-14: 05:00:00 via INTRAVENOUS

## 2013-10-14 NOTE — ED Notes (Signed)
Spoke with Tia regarding need for transport home and d/c.  Inetta Fermoina is unable to pick pt up, however states she can stay at her house.  [employer] [facility] called.

## 2013-10-14 NOTE — ED Notes (Signed)
MD at bedside. 

## 2013-10-14 NOTE — ED Notes (Signed)
Spoke with Marchelle [facility]ksAmanda from [facility] and [facility]  EDP to be updated.

## 2013-10-14 NOTE — ED Provider Notes (Signed)
CSN: [id number]     Arrival date & time 10/14/13  0417 History   First MD Initiated Contact with Patient 10/14/13 [phone]     Chief Complaint  Patient presents with  . Altered Mental Status   (Consider location/radiation/quality/duration/timing/severity/associated sxs/prior Treatment) HPI Level 5 Caveat: somnolent. This is a 33 year old female with a history of narcotic and benzodiazepine abuse. She is in [facility], a rehabilitation facility. She went on a day pass yesterday morning. Reportedly on the way back she had car trouble and was detained. When she arrived back at the facility she was of normal mental status and a drug screen was negative for drugs of abuse. She was given her usual evening doses of Seroquel and trazodone (medication doses are dispensed to her individually). Beginning about 1:30 AM today her roommate noticed her to be acting mentally altered. Specifically she was noted to be having trouble staying in bed, falling when trying to ambulate, unable to stay awake and had slurred speech. Staff brought her here for further evaluation. She denies taking any nonprescribed medications but is clearly somnolent per triage nurse.  Past Medical History  Diagnosis Date  . Anxiety   . Panic attack   . Hypertension   . Substance abuse   . Migraine    Past Surgical History  Procedure Laterality Date  . Cholecystectomy    . Cesarean section    . Lipoma excision     No family history on file. History  Substance Use Topics  . Smoking status: Current Every Day Smoker -- 0.25 packs/day    Types: Cigarettes  . Smokeless tobacco: Not on file  . Alcohol Use: No   OB History   Grav Para Term Preterm Abortions TAB SAB Ect Mult Living                 Review of Systems  Unable to perform ROS   Allergies  Reglan  Home Medications   Current Outpatient Rx  Name  Route  Sig  Dispense  Refill  . benzonatate (TESSALON) 100 MG capsule   Oral   Take 1 capsule (100 mg total) by mouth  every 8 (eight) hours.   21 capsule   0   . ibuprofen (ADVIL,MOTRIN) 800 MG tablet   Oral   Take 1 tablet (800 mg total) by mouth 3 (three) times daily.   21 tablet   0   . lisinopril-hydrochlorothiazide (ZESTORETIC) 10-12.5 MG per tablet   Oral   Take 1 tablet by mouth daily.   30 tablet   1   . meloxicam (MOBIC) 7.5 MG tablet   Oral   Take 1 tablet (7.5 mg total) by mouth daily.   5 tablet   0   . QUEtiapine (SEROQUEL) 100 MG tablet   Oral   Take 150 mg by mouth at bedtime.         . traZODone (DESYREL) 50 MG tablet   Oral   Take 50 mg by mouth at bedtime.          BP 118/74  Pulse 91  Temp(Src) 98.2 F (36.8 C) (Oral)  Resp 16  Ht 5\' 2"  (1.575 m)  Wt 175 lb (79.379 kg)  BMI 32.00 kg/m2  SpO2 100%  LMP 09/30/2013  Physical Exam General: Well-developed, well-nourished female in no acute distress; appearance consistent with age of record HENT: normocephalic; atraumatic Eyes: pupils equal, round and reactive to light; extraocular muscles grossly intact Neck: supple Heart: regular rate and rhythm Lungs: clear  to auscultation bilaterally Abdomen: soft; nondistended; nontender; no masses or hepatosplenomegaly; bowel sounds present Extremities: No deformity; full range of motion; pulses normal Neurologic: Somnolent but arousable; oriented x 3; motor function intact in all extremities and symmetric but ataxic; dysarthric; no facial droop Skin: Warm and dry Psychiatric: Flat affect    ED Course  Procedures (including critical care time)    MDM   Nursing notes and vitals signs, including pulse oximetry, reviewed.  Summary of this visit's results, reviewed by myself:  Labs:  Results for orders placed during the hospital encounter of 10/14/13 (from the past 24 hour(s))  ETHANOL     Status: None   Collection Time    10/14/13  4:50 AM      Result Value Range   Alcohol, Ethyl (B) <11  0 - 11 mg/dL  CBC WITH DIFFERENTIAL     Status: None   Collection  Time    10/14/13  4:50 AM      Result Value Range   WBC 7.0  4.0 - 10.5 K/uL   RBC 4.30  3.87 - 5.11 MIL/uL   Hemoglobin 12.2  12.0 - 15.0 g/dL   HCT 29.5  62.1 - 30.8 %   MCV 88.4  78.0 - 100.0 fL   MCH 28.4  26.0 - 34.0 pg   MCHC 32.1  30.0 - 36.0 g/dL   RDW 65.7  84.6 - 96.2 %   Platelets 273  150 - 400 K/uL   Neutrophils Relative % 63  43 - 77 %   Neutro Abs 4.5  1.7 - 7.7 K/uL   Lymphocytes Relative 29  12 - 46 %   Lymphs Abs 2.1  0.7 - 4.0 K/uL   Monocytes Relative 5  3 - 12 %   Monocytes Absolute 0.4  0.1 - 1.0 K/uL   Eosinophils Relative 1  0 - 5 %   Eosinophils Absolute 0.1  0.0 - 0.7 K/uL   Basophils Relative 1  0 - 1 %   Basophils Absolute 0.0  0.0 - 0.1 K/uL  BASIC METABOLIC PANEL     Status: Abnormal   Collection Time    10/14/13  4:50 AM      Result Value Range   Sodium 141  137 - 147 mEq/L   Potassium 4.0  3.7 - 5.3 mEq/L   Chloride 101  96 - 112 mEq/L   CO2 28  19 - 32 mEq/L   Glucose, Bld 101 (*) 70 - 99 mg/dL   BUN 18  6 - 23 mg/dL   Creatinine, Ser 9.52  0.50 - 1.10 mg/dL   Calcium 9.1  8.4 - 84.1 mg/dL   GFR calc non Af Amer >90  >90 mL/min   GFR calc Af Amer >90  >90 mL/min  ACETAMINOPHEN LEVEL     Status: None   Collection Time    10/14/13  4:50 AM      Result Value Range   Acetaminophen (Tylenol), Serum <15.0  10 - 30 ug/mL  SALICYLATE LEVEL     Status: Abnormal   Collection Time    10/14/13  4:50 AM      Result Value Range   Salicylate Lvl <2.0 (*) 2.8 - 20.0 mg/dL  PREGNANCY, URINE     Status: None   Collection Time    10/14/13  5:10 AM      Result Value Range   Preg Test, Ur NEGATIVE  NEGATIVE  URINE RAPID DRUG SCREEN (HOSP PERFORMED)  Status: Abnormal   Collection Time    10/14/13  5:11 AM      Result Value Range   Opiates NONE DETECTED  NONE DETECTED   Cocaine NONE DETECTED  NONE DETECTED   Benzodiazepines POSITIVE (*) NONE DETECTED   Amphetamines POSITIVE (*) NONE DETECTED   Tetrahydrocannabinol NONE DETECTED  NONE DETECTED    Barbiturates NONE DETECTED  NONE DETECTED   5:17 AM Improved alertness, oxygen saturation after 0.8 mg of Narcan IV.  5:43 AM UDS positive for benzodiazepines. Patient sleeping.  6:54 AM Patient sleeping. Dr. Gwendolyn GrantWalden will follow up and make disposition when patient awake and alert.       Hanley SeamenJohn L Courtez Twaddle, MD 10/14/13 [phone]

## 2013-10-14 NOTE — ED Notes (Signed)
Discussed d/c and f/u instructions with pt.  Pt states she lives 4 hours away, pt's friend Georgia Domia Fields called and message left on voicemail. [phone])

## 2013-10-14 NOTE — Discharge Instructions (Signed)
Polysubstance Abuse °When people abuse more than one drug or type of drug it is called polysubstance or polydrug abuse. For example, many smokers also drink alcohol. This is one form of polydrug abuse. Polydrug abuse also refers to the use of a drug to counteract an unpleasant effect produced by another drug. It may also be used to help with withdrawal from another drug. People who take stimulants may become agitated. Sometimes this agitation is countered with a tranquilizer. This helps protect against the unpleasant side effects. Polydrug abuse also refers to the use of different drugs at the same time.  °Anytime drug use is interfering with normal living activities, it has become abuse. This includes problems with family and friends. Psychological dependence has developed when your mind tells you that the drug is needed. This is usually followed by physical dependence which has developed when continuing increases of drug are required to get the same feeling or "high". This is known as addiction or chemical dependency. A person's risk is much higher if there is a history of chemical dependency in the family. °SIGNS OF CHEMICAL DEPENDENCY °· You have been told by friends or family that drugs have become a problem. °· You fight when using drugs. °· You are having blackouts (not remembering what you do while using). °· You feel sick from using drugs but continue using. °· You lie about use or amounts of drugs (chemicals) used. °· You need chemicals to get you going. °· You are suffering in work performance or in school because of drug use. °· You get sick from use of drugs but continue to use anyway. °· You need drugs to relate to people or feel comfortable in social situations. °· You use drugs to forget problems. °"Yes" answered to any of the above signs of chemical dependency indicates there are problems. The longer the use of drugs continues, the greater the problems will become. °If there is a family history of  drug or alcohol use, it is best not to experiment with these drugs. Continual use leads to tolerance. After tolerance develops more of the drug is needed to get the same feeling. This is followed by addiction. With addiction, drugs become the most important part of life. It becomes more important to take drugs than participate in the other usual activities of life. This includes relating to friends and family. Addiction is followed by dependency. Dependency is a condition where drugs are now needed not just to get high, but to feel normal. °Addiction cannot be cured but it can be stopped. This often requires outside help and the care of professionals. Treatment centers are listed in the yellow pages under: Cocaine, Narcotics, and Alcoholics Anonymous. Most hospitals and clinics can refer you to a specialized care center. Talk to your caregiver if you need help. °Document Released: 05/09/2005 Document Revised: 12/10/2011 Document Reviewed: 09/17/2005 °ExitCare® Patient Information ©2014 [employer]. ° ° ° °Emergency Department Resource Guide °1) Find a Doctor and Pay Out of Pocket °Although you won't have to find out who is covered by your insurance plan, it is a good idea to ask around and get recommendations. You will then need to call the office and see if the doctor you have chosen will accept you as a new patient and what types of options they offer for patients who are self-pay. Some doctors offer discounts or will set up payment plans for their patients who do not have insurance, but you will need to ask so you aren't   surprised when you get to your appointment. ° °2) Contact Your Local Health Department °Not all health departments have doctors that can see patients for sick visits, but many do, so it is worth a call to see if yours does. If you don't know where your local health department is, you can check in your phone book. The CDC also has a tool to help you locate your state's health department, and  many state websites also have listings of all of their local health departments. ° °3) Find a Walk-in Clinic °If your illness is not likely to be very severe or complicated, you may want to try a walk in clinic. These are popping up all over the country in pharmacies, drugstores, and shopping centers. They're usually staffed by nurse practitioners or physician assistants that have been trained to treat common illnesses and complaints. They're usually fairly quick and inexpensive. However, if you have serious medical issues or chronic medical problems, these are probably not your best option. ° °No Primary Care Doctor: °- Call [facility] at  832-8000 - they can help you locate a primary care doctor that  accepts your insurance, provides certain services, etc. °- Physician Referral Service- 1-800-533-3463 ° °Chronic Pain Problems: °Organization         Address  Phone   Notes  °[facility]  (336) 297-2271 Patients need to be referred by their primary care doctor.  ° °Medication Assistance: °Organization         Address  Phone   Notes  °[place] [facility] [address] °[place], [place] [address] (336) 641-8030 --Must be a resident of [place] °-- Must have NO insurance coverage whatsoever (no Medicaid/ Medicare, etc.) °-- The pt. MUST have a primary care doctor that directs their care regularly and follows them in the community °  °[facility]  (866) 331-1348   °[employer]  (888) 892-1162   ° °Agencies that provide inexpensive medical care: °Organization         Address  Phone   Notes  °[facility]  (336) 832-8035   °[facility]    (336) 832-7272   °[facility] [address] °[place], [place] [address] (336) 832-4777   °[facility] [address], °[place] (336) 271-4999   °[facility]    (336) 373-0678   °[facility]    (336) 272-1050   °[facility] ° [address], [place] Phone:  (336) 832-4444, Fax:  (336) 832-4440 Hours of Operation:  9 am - 6 pm, M-F.  Also accepts Medicaid/Medicare and self-pay.  °[facility] ° [address], [place] Phone: (336) 832-3150, Fax: (336) 832-3151. Hours of Operation:  8:30 am - 5:30 pm, M-F.  Also accepts Medicaid and self-pay.  °[facility] [place] [address], [place] Phone: (336) 878-6027   °[facility] [address], [place], [place] (336)723-1848, Ext. 123 Mondays & Thursdays: 7-9 AM.  First 15 patients are seen on a first come, first serve basis. °  ° °Medicaid-accepting [place] Providers: ° °Organization         Address  Phone   Notes  °[facility] [address], [place] (336) 641-2100 Also accepts self-pay patients.  °[facility] [address],  ° (336) 856-9996   °[facility] [address], [place] (336) 288-8857   °[facility] [address] [place] [address], [place] (336) 299-7000   °Veita Bland [address], [place]  ° (336) 373-1557 Only accepts [place] Access Medicaid patients after they have their name applied to their card.  ° °Self-Pay (no insurance) in [place]: ° °Organization         Address  Phone   Notes  °Sickle Cell Patients, [facility] [address], [place] (336) 832-1970   °[facility] [address], [place] (336) 832-4400   ° [facility] ° [address], [place] (336) 992-4800   °[facility]/Dr. Osei-Bonsu ° [address] [place] [address], [place] or [address], [place] (336) 841-8500 Phone number for both [place] and [place] locations is the same.  °[facility] [address], [place] (336) 299-0000   °[facility]  [address] [place] [address], [place] or [address] (336) 852-7530 °(336) 878-2260   °[facility] [address], [place] (336) 350-1642, phone; (336) 294-5005, fax Sees patients 1st and 3rd Saturday of every month.  Must not qualify for public or private insurance (i.e. Medicaid, Medicare, [place] Health Choice, Veterans' Benefits) • Household income should be no more than 200% of the poverty level •The clinic cannot treat you if you are pregnant or think you are pregnant • Sexually transmitted diseases are not treated at the clinic.  ° ° °Dental Care: °Organization         Address  Phone  Notes  °[facility] [address], [place] (336) 641-6152 Accepts children up to age 21 who are enrolled in Medicaid or [place] Health Choice; pregnant women with a Medicaid card; and children who have applied for Medicaid or [place] Health Choice, but were declined, whose parents can pay a reduced fee at time of service.  °[facility] [place]  [address], [place] (336) 641-7733 Accepts children up to age 21 who are enrolled in Medicaid or [place] Health Choice; pregnant women with a Medicaid card; and children who have applied for Medicaid or [place] Health Choice, but were declined, whose parents can pay a reduced fee at time of service.  °[facility] ° [address],  (336) 641-4533 Patients are seen by appointment only. Walk-ins are not accepted. [facility] will see patients 18 years of age and older. °Monday - Tuesday (8am-5pm) °Most Wednesdays (8:30-5pm) °$30 per visit, cash only  °[facility] ° [address], [place] (336) 641-4533 Patients are seen by appointment only. Walk-ins are not accepted. [facility] will see patients 18 years of age and older. °One Wednesday Evening (Monthly: Volunteer Based).  $30 per visit, cash only   °[facility]  (919) 537-3737 for adults; Children under age 4, call Graduate Pediatric Dentistry at ([phone]) [phone]. Children aged 4-14, please call (919) [phone] to request a pediatric application. ° Dental services are provided in all areas of dental care including fillings, crowns and bridges, complete and partial dentures, implants, gum treatment, root canals, and extractions. Preventive care is also provided. Treatment is provided to both adults and children. °Patients are selected via a lottery and there is often a waiting list. °  °  [facility] [address], °[place] ° (336) 763-8833 [email or web address] °  °[facility] [address], [place], [place] (336)723-1848, Ext. 123 Second and Fourth Thursday of each month, opens at 6:30 AM; Clinic ends at 9 AM.  Patients are seen on a first-come first-served basis, and a limited number are seen during each clinic.  ° °[facility] ° [address], [place], [place] (336) 723-7904   Eligibility Requirements °You must have lived in [place], [place], or [place] counties for at least the last three months. °  You cannot be eligible for state or federal sponsored healthcare insurance, including Veterans Administration, Medicaid, or Medicare. °  You generally cannot be eligible for healthcare insurance through your employer.  °  How to apply: °Eligibility screenings are held every Tuesday and Wednesday afternoon from 1:00 pm until 4:00 pm. You do not need an appointment for the interview!  °[facility] [address], [place], [place] 336-631-2330   °[facility]  336-342-8273   °[facility]  336-703-3100   °[facility]  336-570-6415   ° °Behavioral Health Resources in the Community: °Intensive Outpatient Programs °Organization         Address  Phone  Notes  °[facility] [address], [place],  [place] 336-878-6098   °[facility] [address], [place], [place] 336-832-9800   °[facility] [address], [place], [place] ° 336-882-2125   °[facility] [address],  °[place], [place] 1-800-853-5163 or 336-641-4981   °Substance Abuse Resources °Organization         Address  Phone  Notes  °[facility]  336-882-2125   °[facility]  336-784-9470   °[facility]  336-285-9073   °[facility]  336-845-3988   °Residential & Outpatient Substance Abuse Program  1-800-659-3381   °Psychological Services °Organization         Address  Phone  Notes  °[facility]  336- 832-9600   °[facility]  336- 378-7881   °[facility] [address], [place] 1-800-853-5163 or 336-641-4981   ° °Mobile Crisis Teams °Organization         Address  Phone  Notes  °[facility], Mobile Crisis Care Unit  1-877-626-1772   °Assertive °[facility] ° [address] [place], [place] 336-834-9664   °Sharon DeEsch [address] °[place] 336-554-5454   ° °Self-Help/Support Groups °Organization         Address  Phone             Notes  °[facility] - variety of support groups  336- 373-1402 Call for more information  °Narcotics Anonymous (NA), [facility] [address], °[place]  2 meetings at this location  ° °Residential Treatment Programs °Organization         Address  Phone  Notes  °[facility] [address],    °[place]  1-866-801-8205   °[facility] ° [address], [place], [place] 704-293-8524   °[facility] [address], [place] 336-845-3988 Admissions: 8am-3pm M-F  °[facility] [address]    °[place], [place] 336-841-1104   °[facility] [address], , [place] 336-379-7146   °[facility] [address]  °[place], [place] 336-285-9073   °[facility] - Intensive Outpatient [address], [place], [place] 336-852-3033   °[facility] [address]  °[place], [place] 1-877-615-2722 or 336-784-9470   °[facility] [address] [place], [place] 336-227-7417 Accepts Medicaid  °[facility] [address]  °[place] 1-800-659-3381 Substance Abuse/Addiction Treatment  ° °[place] Behavioral Health Resources °Organization         Address  Phone  Notes  °[facility]  (888) 581-9988   °Julie Brannon, PhD [address] [place], [place]   (336) 349-5553 or (336) 951-0000   °[facility]   [address] °[place], [place] (336) 349-4454   °[facility] [address], [place], [place] (336) 342-8316 Insurance/Medicaid/sponsorship through [facility]  °[facility] [address]                                    [place], [place] (336) 342-8316 Therapy/tele-psych/case  °[facility] [address]  ° [place], [place] (336) 349-2233    °Dr. Arfeen  (336) 349-4544   °[facility] [place]  [employer] [place] [facility] 1) [address],  °2) [address], [place] °3)  [address], [place] (336) 349-3220 °(336) 342-7768 ° °(336) 342-8140   °[place] [facility] (336) 342-1394 or (336) 342-3537 (After Hours)    ° ° ° °

## 2013-10-14 NOTE — ED Notes (Signed)
Report received from Diane, RN, care assumed.

## 2013-10-14 NOTE — ED Notes (Signed)
Pt awakens to voice.  Pt continues to be drowsy but follows commands and is CAOx4.

## 2013-10-14 NOTE — ED Provider Notes (Signed)
0700 - care from Dr. Betsey HolidayMathis. Patient was somnolent a mark. She had a pass on the previous day. She was not acting herself and sent here. She tested positive UDS for benzos and opiates. Patient admitted to taking Xanax and Percocet. [facility] refused [facility] when we contacted them this morning since she tested positive for drugs. She's back to her baseline acting well now. Denies SI, HI. She is stable for discharge.  1. Drug overdose   2. Opiate use      Michelle HaitWilliam Sissy Goetzke, MD 10/14/13 [phone]

## 2013-10-14 NOTE — ED Notes (Signed)
[facility] called for update on pt status.

## 2013-10-14 NOTE — ED Notes (Signed)
[facility] states pt had a day pass today came in late, took UDS that was neg and pt was acting normal then around 0130am pts roommate went to staff d/t pt falling out of bed, slurred speech, unable to stay awake. 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

## 2015-04-29 IMAGING — CR DG CHEST 2V
2 series · 2 of 2 positions shown · non-contrast
Comparison: None.

CLINICAL DATA: Cough and congestion

EXAM:
CHEST  2 VIEW

[w chest pa]
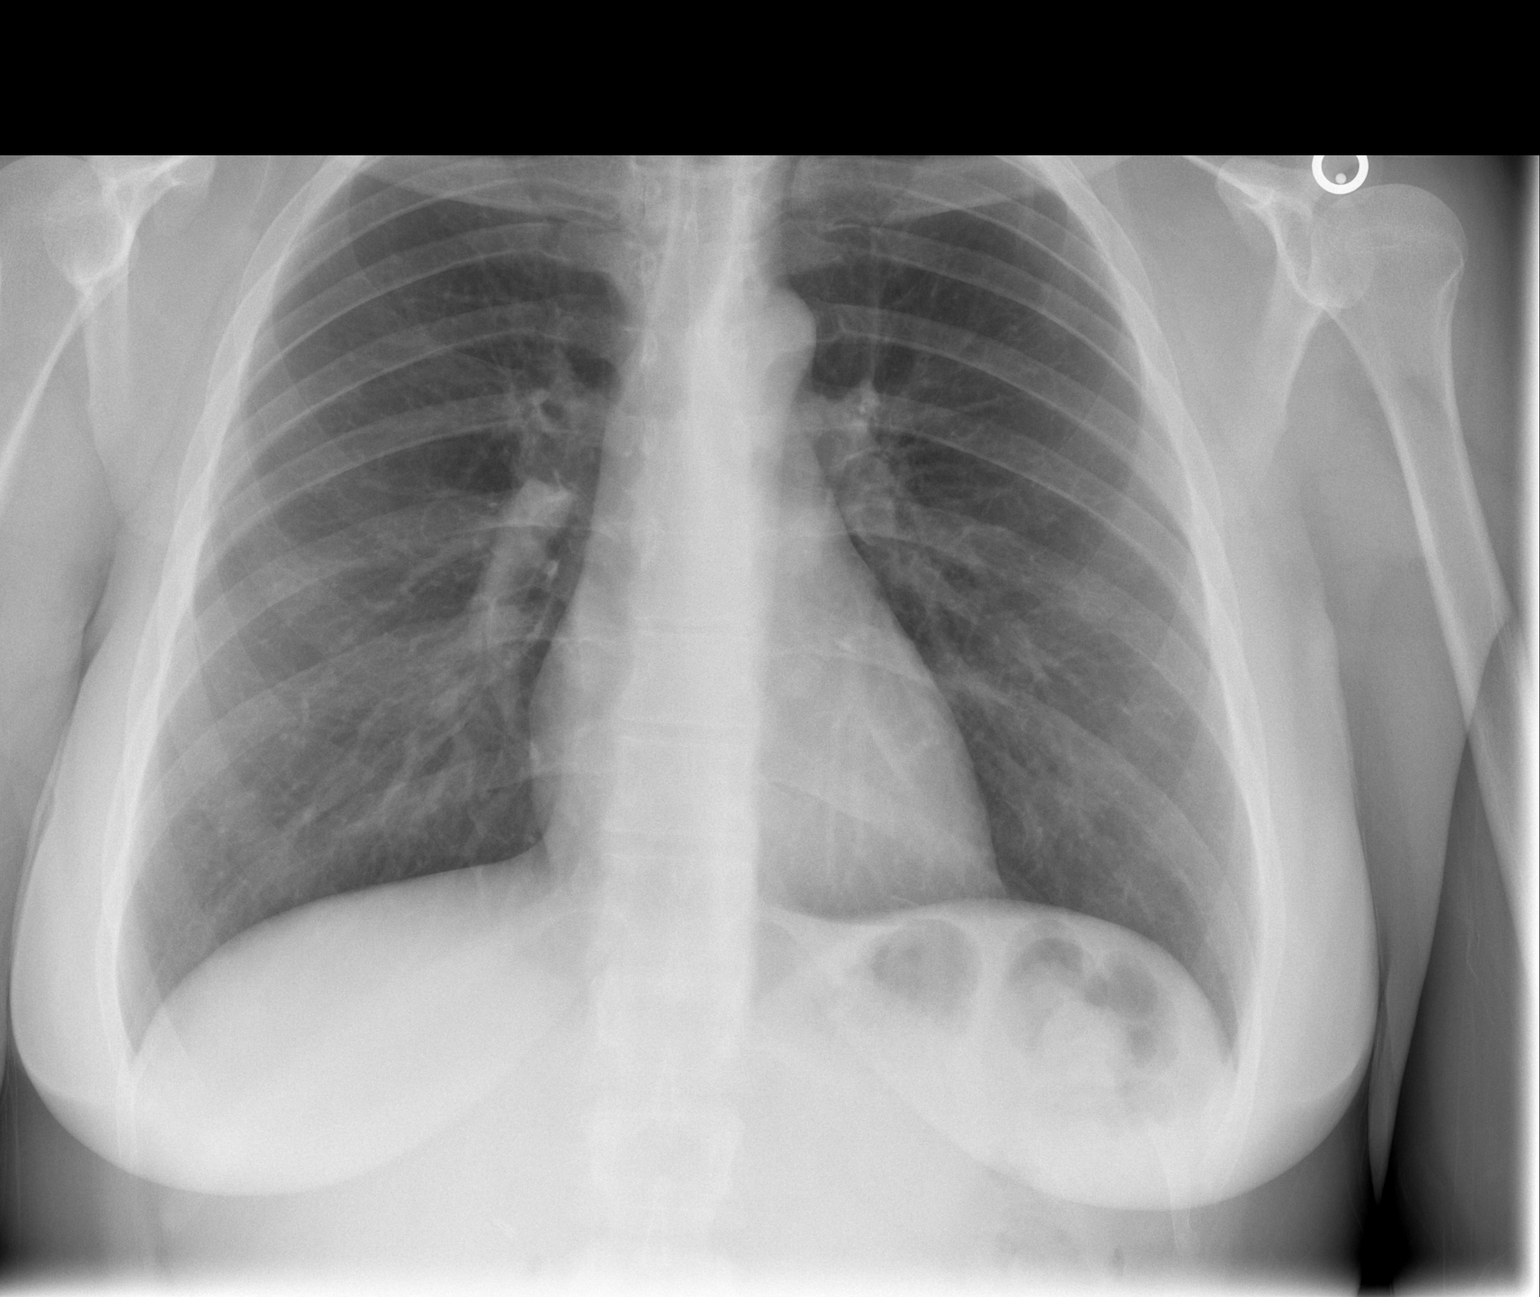

[w chest lat]
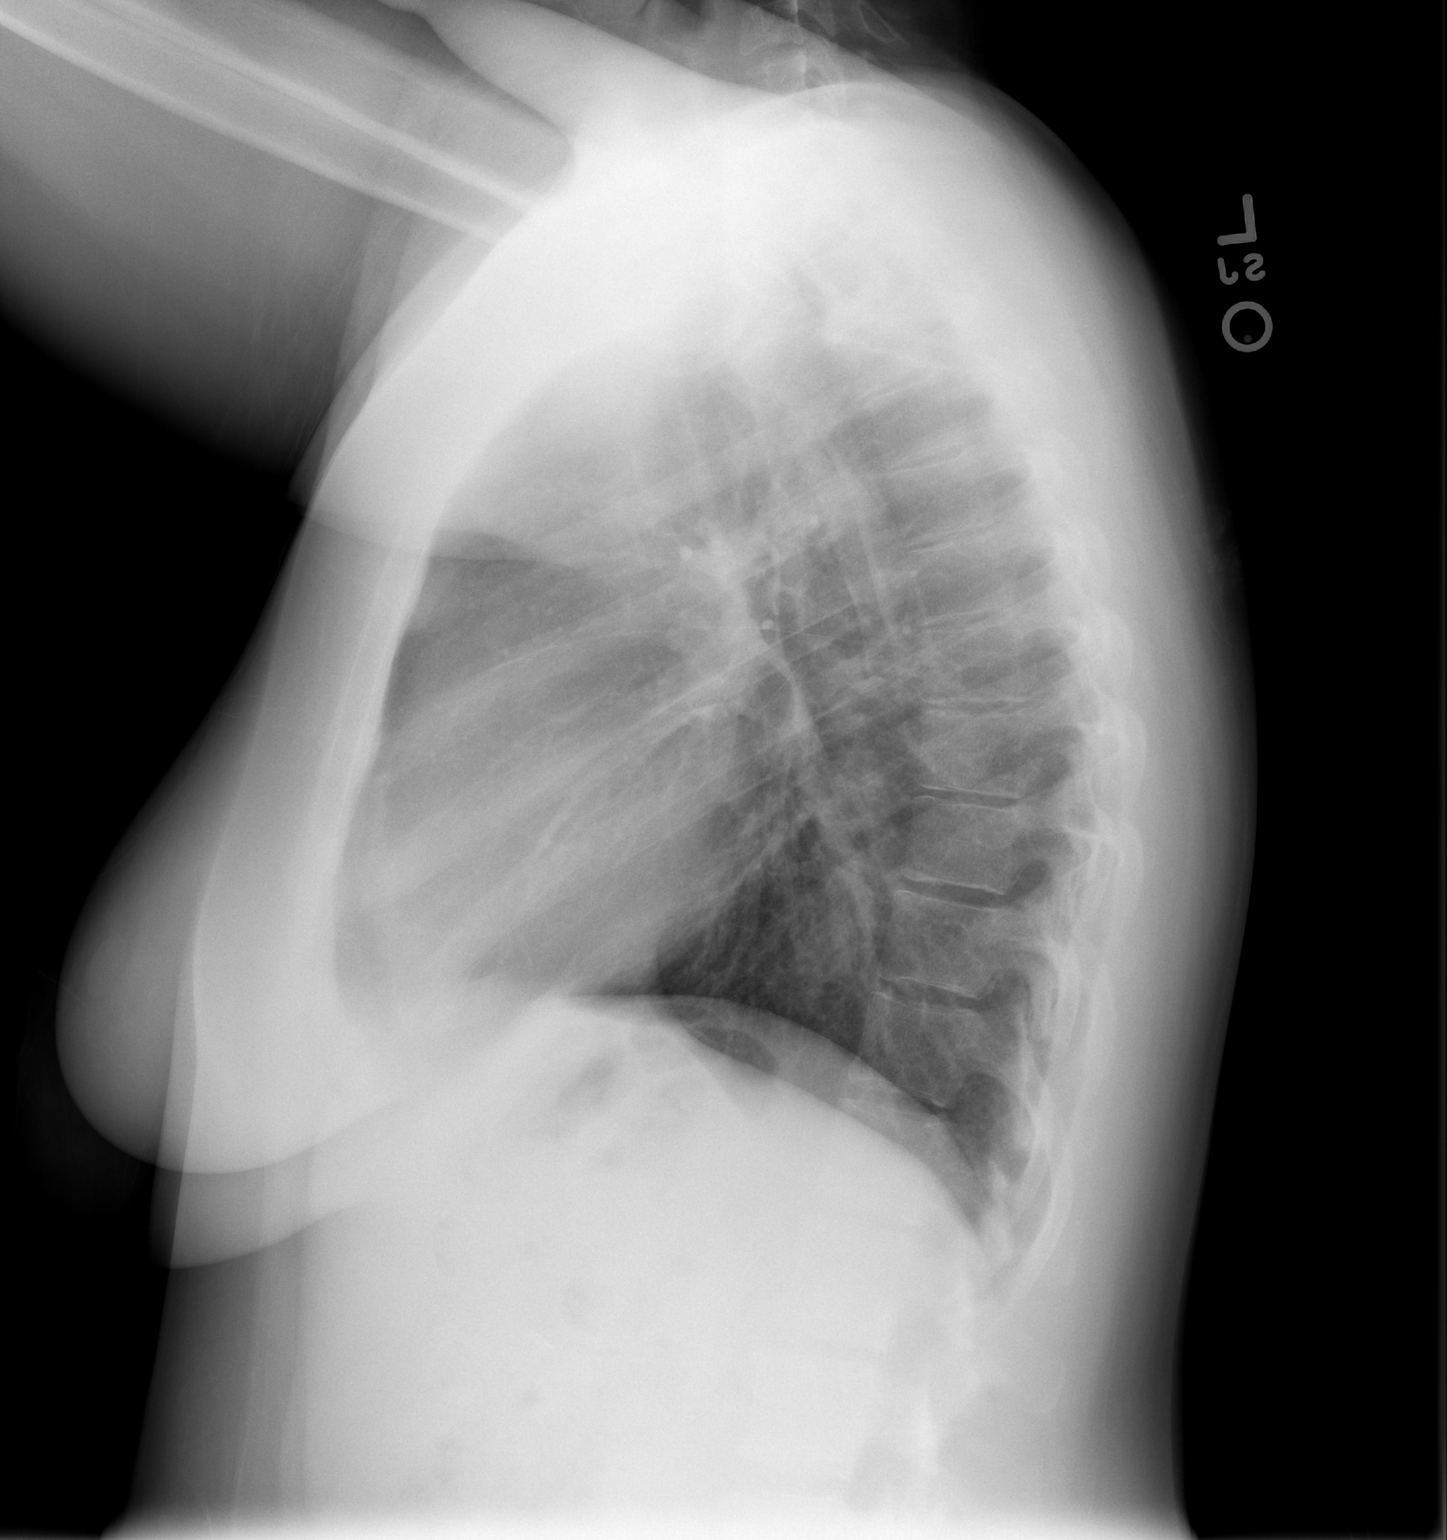

[2 of 2 positions shown; findings below may reference images not displayed]

FINDINGS: The heart size and mediastinal contours are within normal limits.
Both lungs are clear. The visualized skeletal structures are
unremarkable.
IMPRESSION: No active cardiopulmonary disease.

## 2015-05-31 IMAGING — CR DG ANKLE COMPLETE 3+V*L*
3 series · 3 of 3 positions shown · non-contrast
Comparison: None.

CLINICAL DATA: Left ankle pain after fall.

EXAM:
LEFT ANKLE COMPLETE - 3+ VIEW

[t ankle joint ap left]
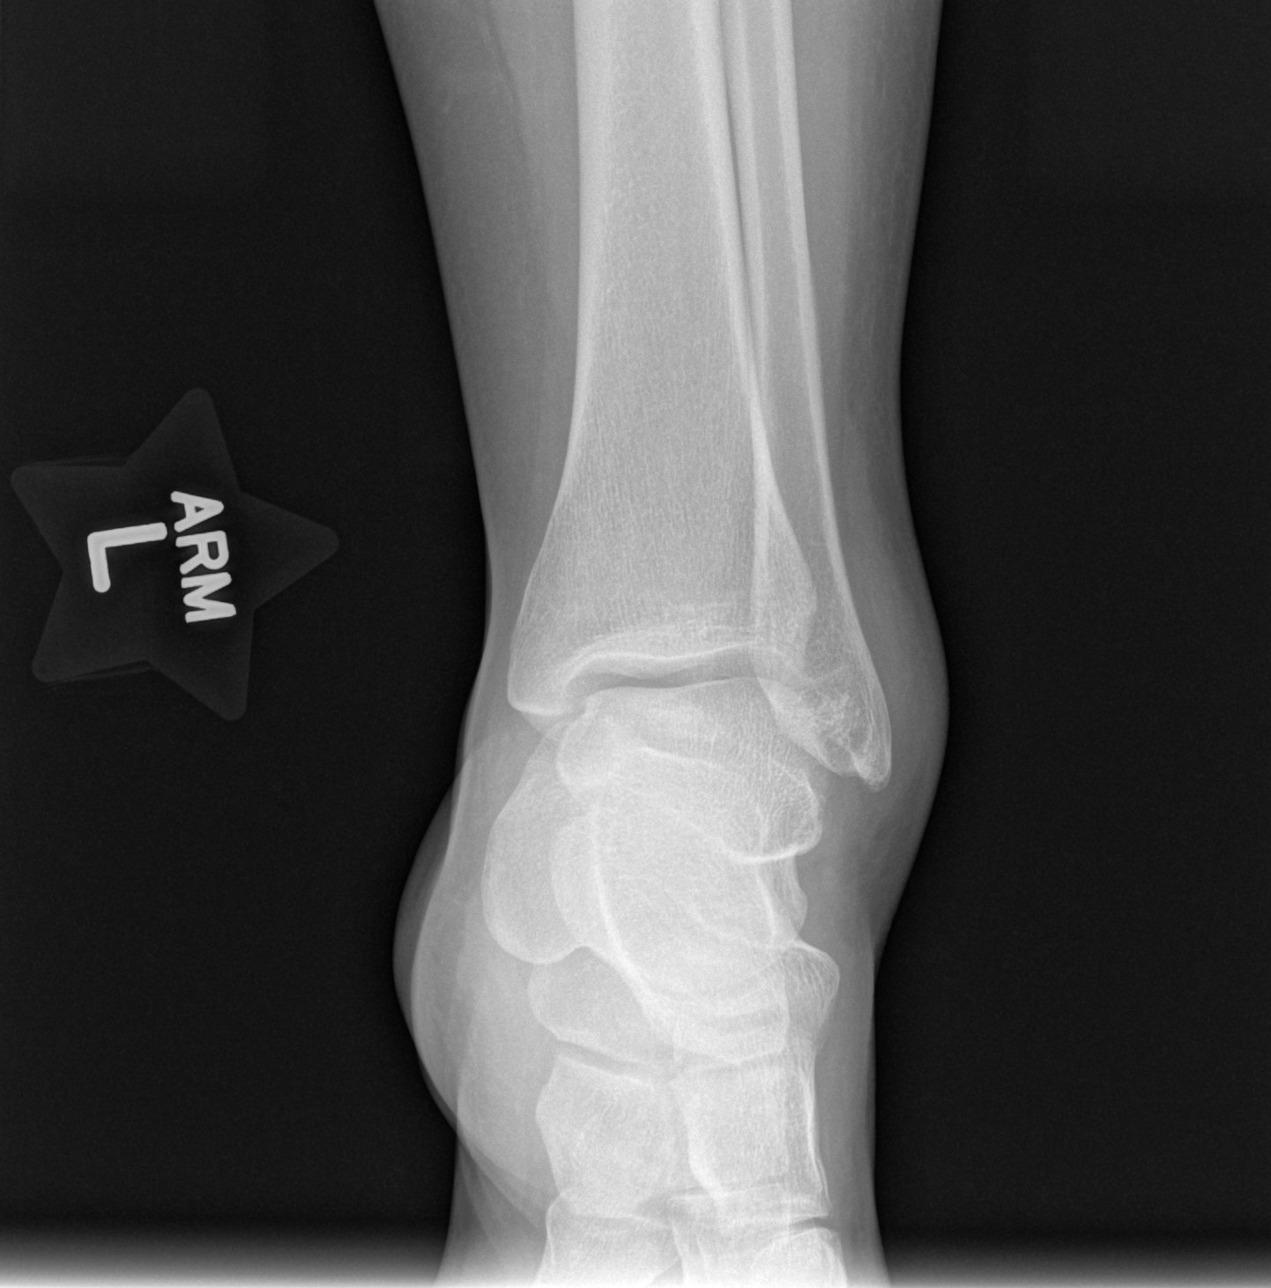

[t ankle joint oblique left]
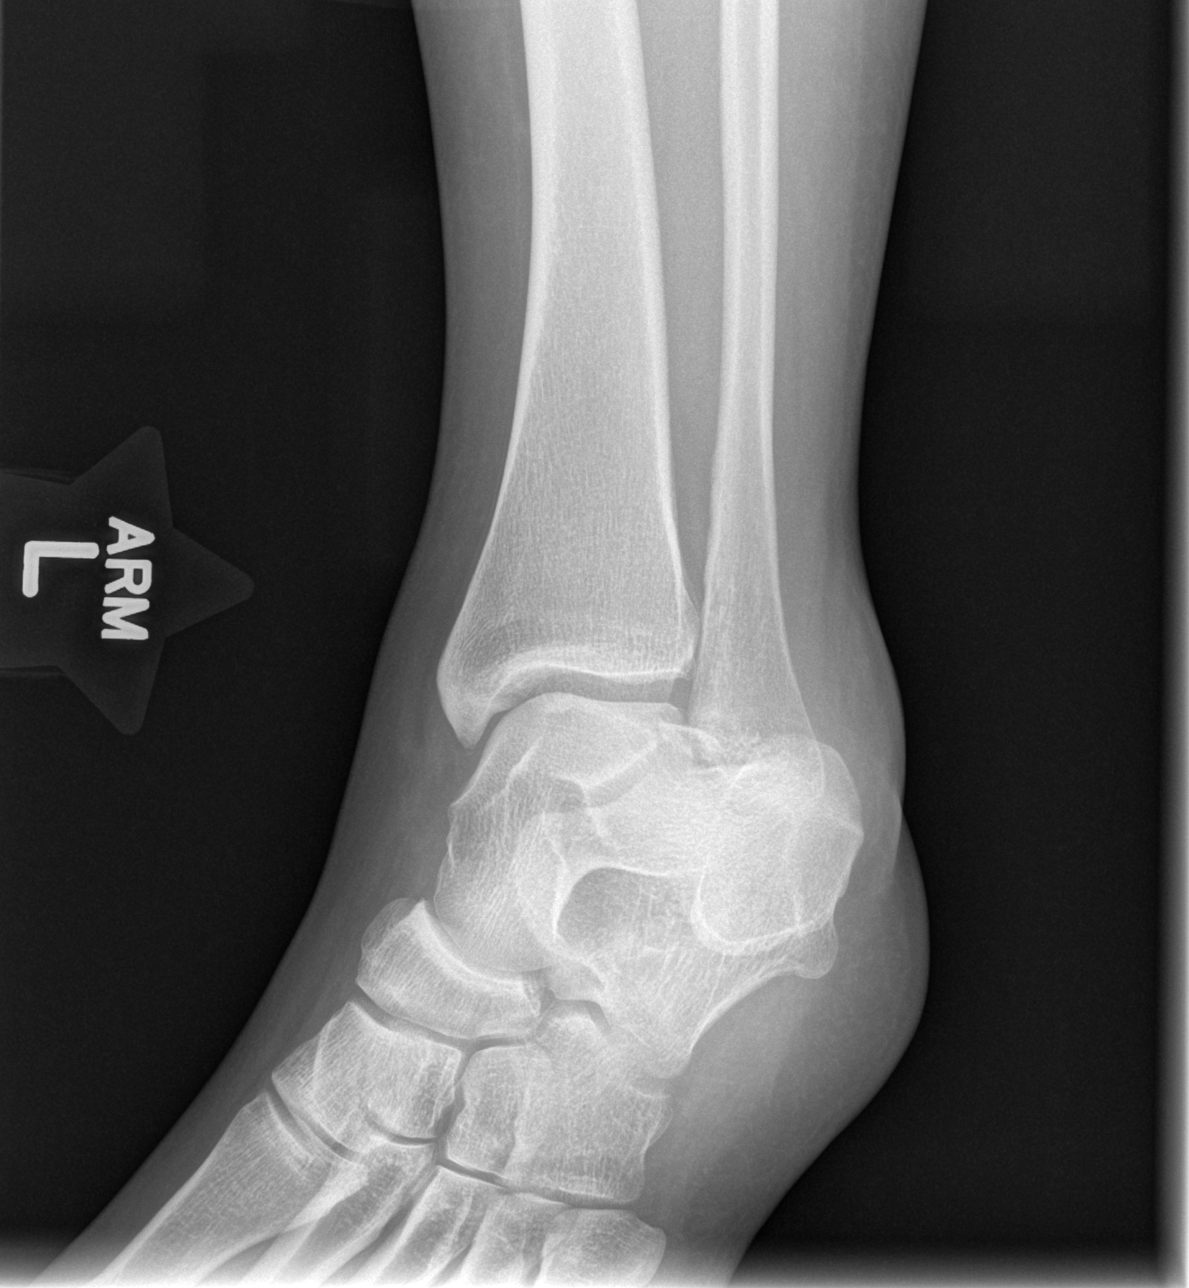

[t ankle joint lat left]
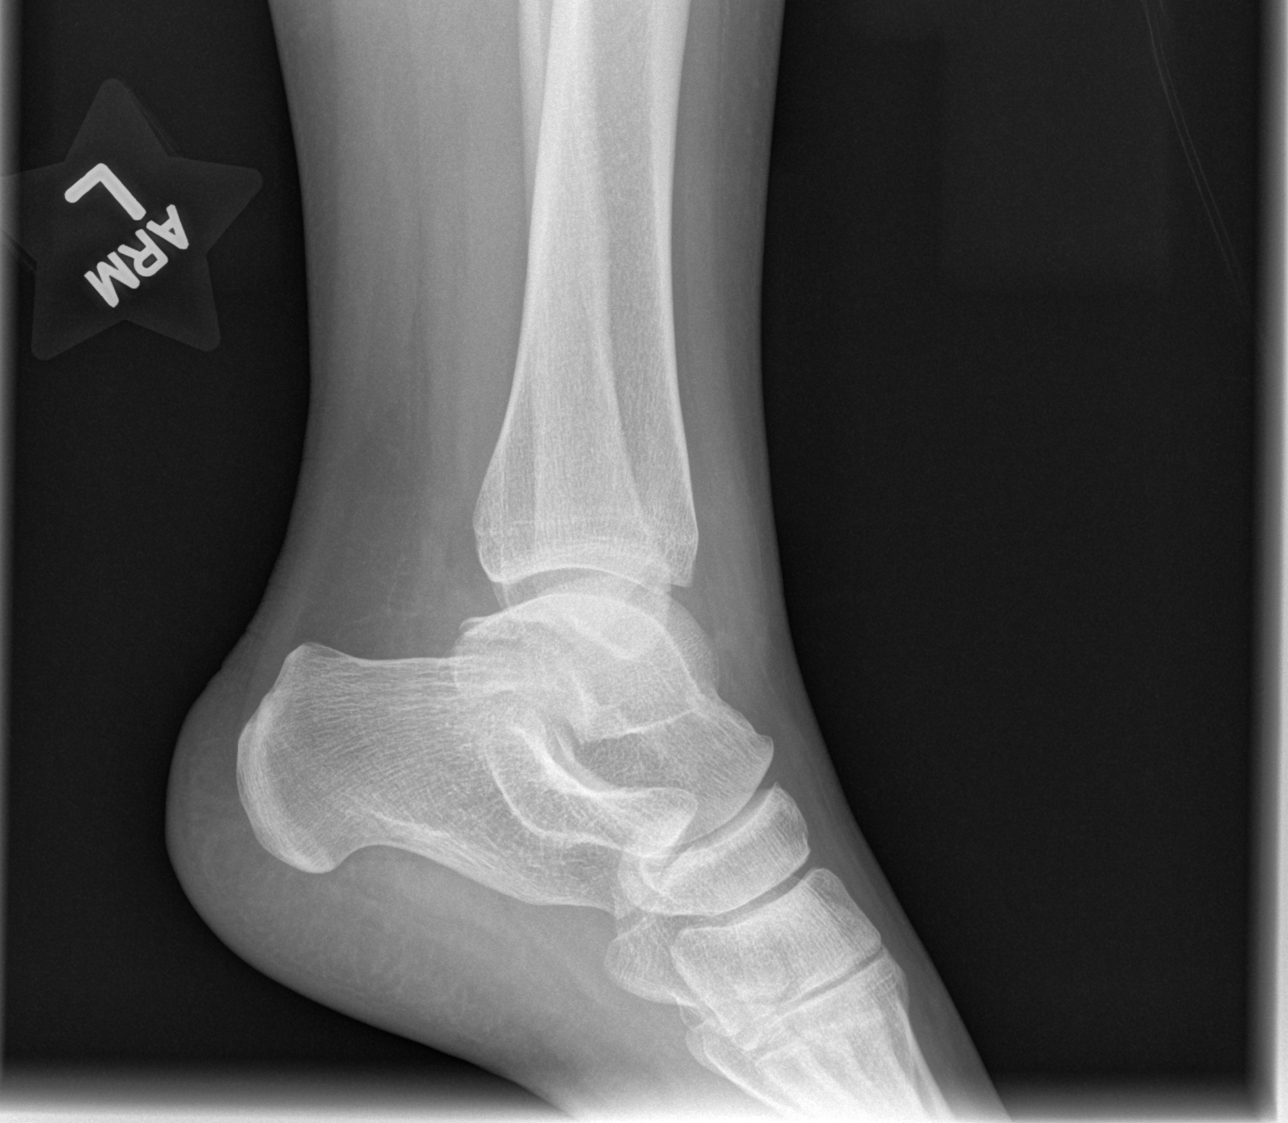

[3 of 3 positions shown; findings below may reference images not displayed]

FINDINGS: There is no evidence of fracture, dislocation, or joint effusion.
There is no evidence of arthropathy or other focal bone abnormality.
Soft tissue swelling over lateral malleolus is noted.
IMPRESSION: No fracture or dislocation is noted. Soft tissue swelling is seen
over lateral malleolus suggesting ligamentous injury.
